# Patient Record
Sex: Female | Born: 1970 | Race: Black or African American | Hispanic: No | Marital: Single
Health system: Southern US, Community
[De-identification: ages and names within clinical notes are randomized; demographics above are authoritative.]

---

## 1998-04-15 ENCOUNTER — Ambulatory Visit (HOSPITAL_COMMUNITY): Admission: RE | Admit: 1998-04-15 | Discharge: 1998-04-15 | Payer: Self-pay | Admitting: Obstetrics and Gynecology

## 1999-11-28 ENCOUNTER — Inpatient Hospital Stay (HOSPITAL_COMMUNITY): Admission: AD | Admit: 1999-11-28 | Discharge: 1999-11-28 | Payer: Self-pay | Admitting: *Deleted

## 2000-01-03 ENCOUNTER — Inpatient Hospital Stay (HOSPITAL_COMMUNITY): Admission: AD | Admit: 2000-01-03 | Discharge: 2000-01-04 | Payer: Self-pay | Admitting: *Deleted

## 2004-04-09 ENCOUNTER — Emergency Department (HOSPITAL_COMMUNITY): Admission: EM | Admit: 2004-04-09 | Discharge: 2004-04-09 | Payer: Self-pay | Admitting: Emergency Medicine

## 2006-02-13 ENCOUNTER — Other Ambulatory Visit: Admission: RE | Admit: 2006-02-13 | Discharge: 2006-02-13 | Payer: Self-pay | Admitting: Family Medicine

## 2009-11-05 ENCOUNTER — Emergency Department (HOSPITAL_COMMUNITY): Admission: EM | Admit: 2009-11-05 | Discharge: 2009-11-05 | Payer: Self-pay | Admitting: Emergency Medicine

## 2011-01-18 ENCOUNTER — Ambulatory Visit: Payer: Self-pay | Admitting: *Deleted

## 2011-01-19 NOTE — Discharge Summary (Signed)
[facility]  Patient:    Janet Alvarado, Janet Alvarado                      MRN: [id number] Adm. Date:  [id number] Attending:  Ardeen Fillers                           Discharge Summary  HISTORY OF PRESENT ILLNESS:   A 40 year old woman, gravida 3, para 1-0-1-1, now at 35-4/[redacted] weeks gestational age with complaints of decreased fetal activity and uterine contractions while at work today.  She has noted no bleeding and no rupture of membranes.  She was examined in the office today and was 1 cm dilated, 80% effaced, and -1 station.  Group B Strep was sent from the office today.  OBJECTIVE:                    VITAL SIGNS: Afebrile, vital signs stable. ABDOMEN: Soft, nontender, fundus nonirritable.  Monitoring showed reactive fetal heart rate tracing with three uterine contractions over an hour and a half.  ASSESSMENT:                   1. Decreased fetal activity - reassuring fetal heart                                  rate tracing.                               2. Complaints of uterine contractions - no labor.  PLAN:                         Discharge to home, encourage good hydration. Follow up in the office in one week.  Labor precautions discussed.  If contracting more at work tomorrow, the patient will contact the office and she will start her Leave from work.  She will continue kick counts.  Encouraged not to smoke. DD:  11/28/99 TD:  11/29/99 Job: [id number]

## 2011-01-19 NOTE — Discharge Summary (Signed)
[facility]  Patient:    Janet Alvarado, Janet Alvarado                      MRN: [id number] Adm. Date:  [id number] Disc. Date: [id number] Attending:  Ardeen Fillers CC:         Sung Amabile. Roslyn Smiling, M.D.                           Discharge Summary  DISCHARGE DIAGNOSES:          1. Intrauterine pregnancy at term.                               2. Rh positive.                               3. Smoker.  OPERATIVE PROCEDURES:         1. Spontaneous vaginal delivery.                               2. Repair of second-degree periurethral                                  laceration.                               3. Pitocin augmentation on Jan 03, 2000.  The patient is a 40 year old woman, G3P1-0-1-1, [facility] December 29, 1999, based on dates and mid second trimester ultrasound admitted in active labor.  Antenatal course itself has been remarkable for: 1. Bacterial vaginosis treated. 2. Smoking three to four cigarettes daily despite encouragement to stop.  At the time of admission, the patient was in active labor.  Amniotomy was performed for clear fluid.  Pitocin augmentation was initiated for a dysfunctional uterine contraction pattern.  Labor progressed quickly thereafter with a second stage of one minute.  She delivered in a sterile, controlled fashion of a live female, 3625 gm, with Apgars of 9 and 9.  A small second-degree periurethral laceration was repaired without difficulty. Estimated blood loss was 350 cc and there were no complications.  The patients postpartum course was unremarkable.  She chose to bottle feed. Her hemoglobin stabilized at 10.1.  She was discharged to home in satisfactory condition.  Routine status post vaginal delivery instructions were given.  MEDICATIONS AT DISCHARGE:  Prenatal vitamins, iron and nonsteroidal anti-inflammatory medication.  DISCHARGE INSTRUCTIONS:  She was strongly encouraged to stop smoking.   She will be followed up in the office in four to six weeks.  DD:  02/27/00 TD:  02/29/00 Job: [id number]

## 2011-02-08 ENCOUNTER — Ambulatory Visit: Payer: Self-pay | Admitting: *Deleted

## 2011-08-13 IMAGING — CR DG CHEST 2V
2 series · 2 of 2 positions shown · non-contrast
Comparison: None.

CLINICAL DATA: Chest pain

CHEST - 2 VIEW

[w chest pa *]
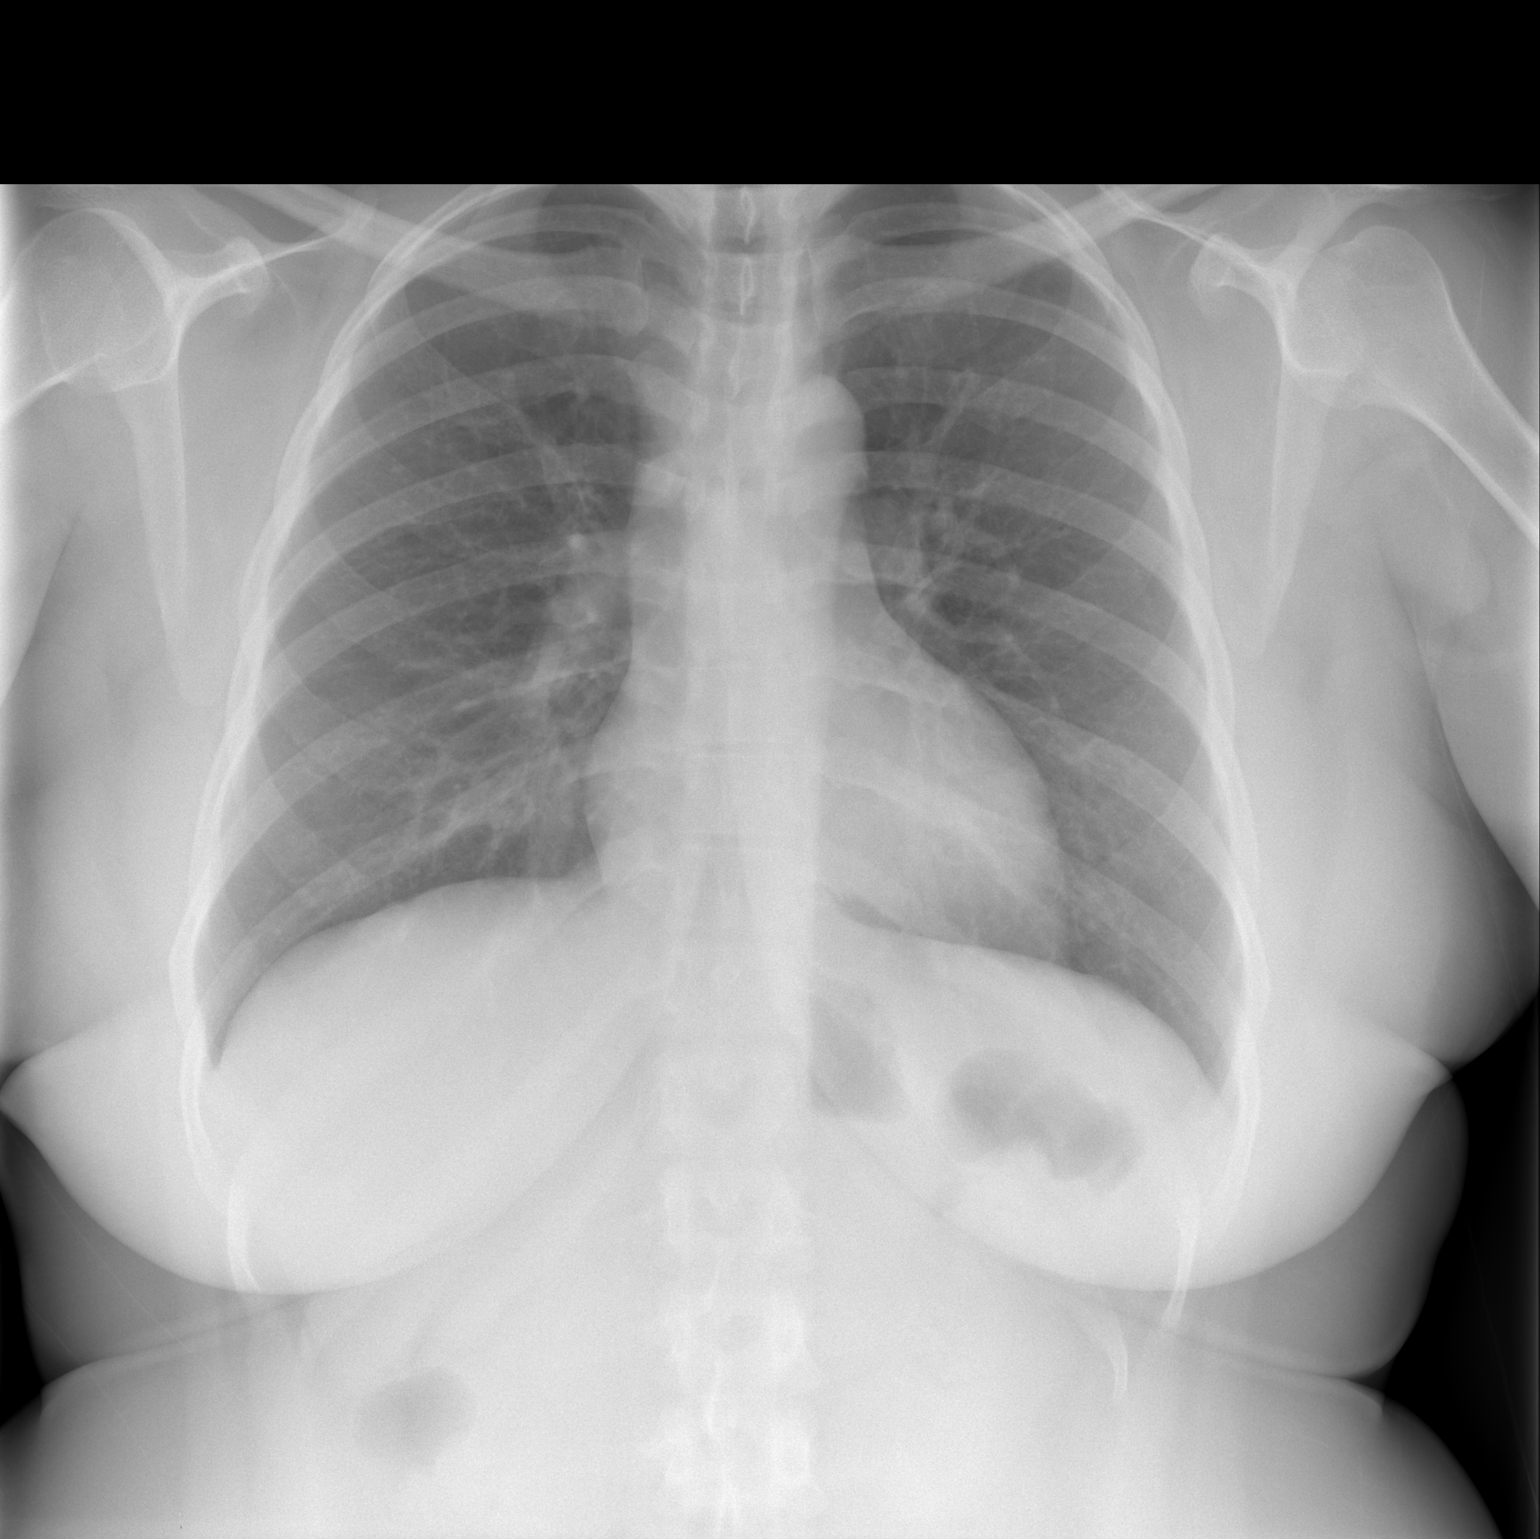

[w chest lat]
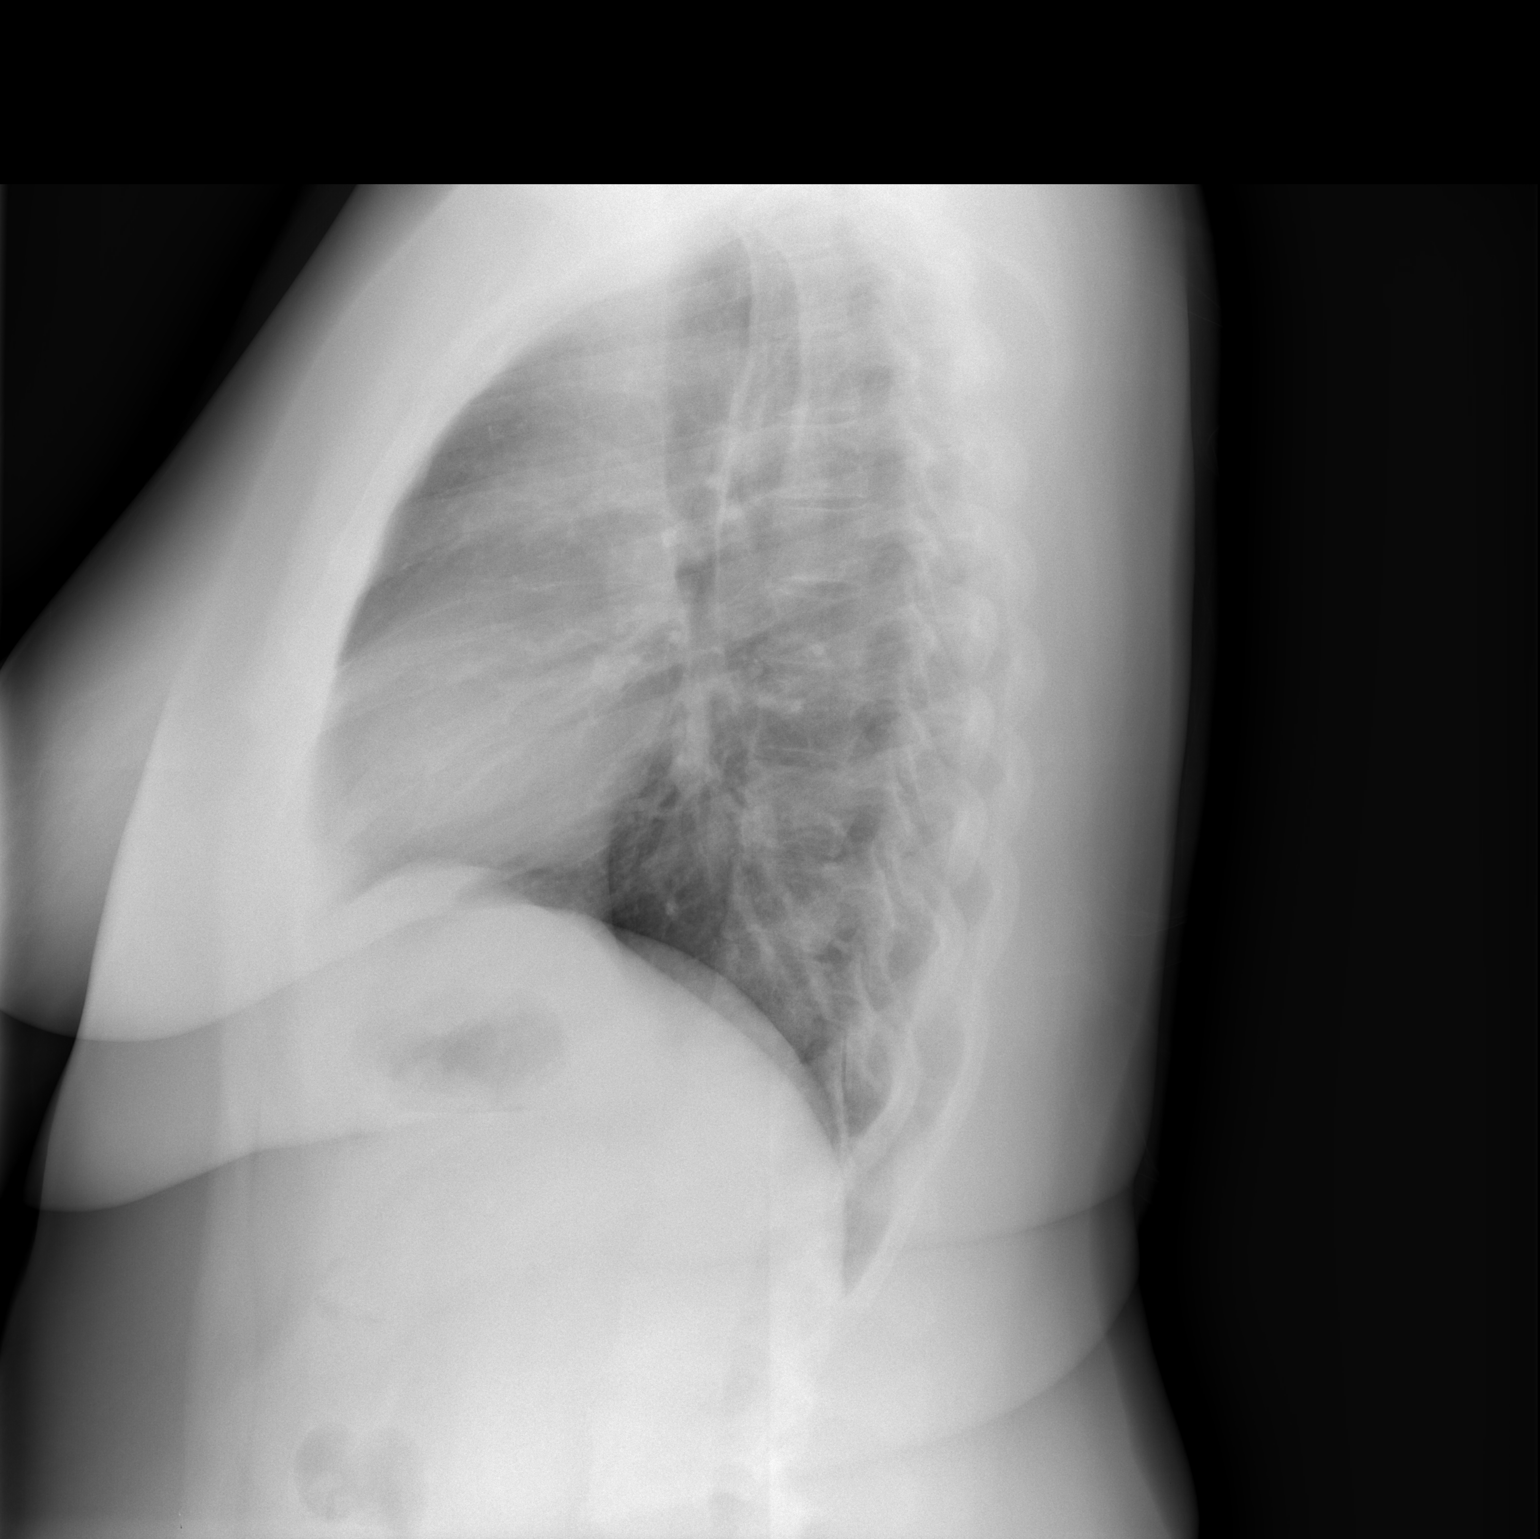

[2 of 2 positions shown; findings below may reference images not displayed]

FINDINGS: Cardiomediastinal silhouette is within normal limits. The
lungs are clear. No pleural effusion.  No pneumothorax.  No acute
osseous abnormality.
IMPRESSION: Normal chest.

## 2018-06-11 DIAGNOSIS — Z6836 Body mass index (BMI) 36.0-36.9, adult: Secondary | ICD-10-CM | POA: Diagnosis not present

## 2018-06-11 DIAGNOSIS — Z713 Dietary counseling and surveillance: Secondary | ICD-10-CM | POA: Diagnosis not present

## 2018-07-16 DIAGNOSIS — Z6836 Body mass index (BMI) 36.0-36.9, adult: Secondary | ICD-10-CM | POA: Diagnosis not present

## 2018-07-16 DIAGNOSIS — Z713 Dietary counseling and surveillance: Secondary | ICD-10-CM | POA: Diagnosis not present

## 2019-12-01 ENCOUNTER — Ambulatory Visit: Payer: Medicaid Other | Attending: Family

## 2019-12-01 DIAGNOSIS — Z23 Encounter for immunization: Secondary | ICD-10-CM

## 2019-12-01 NOTE — Progress Notes (Signed)
   Covid-19 Vaccination Clinic  Name:  RALEY NOVICKI    MRN: [id number] DOB: 01/25/1971  12/01/2019  Ms. Labus was observed post Covid-19 immunization for 15 minutes without incident. She was provided with Vaccine Information Sheet and instruction to access the V-Safe system.   Ms. Cervantes was instructed to call 911 with any severe reactions post vaccine: Marland Kitchen Difficulty breathing  . Swelling of face and throat  . A fast heartbeat  . A bad rash all over body  . Dizziness and weakness   Immunizations Administered    Name Date Dose VIS Date Route   Moderna COVID-19 Vaccine 12/01/2019  4:03 PM 0.5 mL 08/04/2019 Intramuscular   Manufacturer: Moderna   Lot: [id number]   NDC: 55208-022-33
